# Patient Record
Sex: Male | Born: 2000 | Race: White | Hispanic: No | Marital: Single | Smoking: Never smoker
Health system: Southern US, Community
[De-identification: ages and names within clinical notes are randomized; demographics above are authoritative.]

## PROBLEM LIST (undated history)

## (undated) DIAGNOSIS — A770 Spotted fever due to Rickettsia rickettsii: Secondary | ICD-10-CM

## (undated) DIAGNOSIS — S060XAA Concussion with loss of consciousness status unknown, initial encounter: Secondary | ICD-10-CM

## (undated) DIAGNOSIS — S060X9A Concussion with loss of consciousness of unspecified duration, initial encounter: Secondary | ICD-10-CM

## (undated) HISTORY — DX: Concussion with loss of consciousness of unspecified duration, initial encounter: S06.0X9A

## (undated) HISTORY — DX: Concussion with loss of consciousness status unknown, initial encounter: S06.0XAA

## (undated) HISTORY — DX: Spotted fever due to Rickettsia rickettsii: A77.0

## (undated) HISTORY — PX: OTHER SURGICAL HISTORY: SHX169

## (undated) HISTORY — PX: TONSILLECTOMY AND ADENOIDECTOMY: SUR1326

## (undated) HISTORY — PX: ADENOIDECTOMY: SUR15

---
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
## 2019-01-23 ENCOUNTER — Encounter: Payer: Self-pay | Admitting: Family Medicine

## 2019-01-23 ENCOUNTER — Ambulatory Visit: Payer: Medicaid Other | Admitting: Family Medicine

## 2019-01-23 NOTE — Progress Notes (Deleted)
PATIENT: Paul Pugh DOB: [birth year]  REASON FOR VISIT: follow up HISTORY FROM: patient  No chief complaint on file.    HISTORY OF PRESENT ILLNESS: Today 01/23/19 Paul Pugh is a 19 y.o. male here today for follow up for headaches. He was started on nortriptyline 20mg  at bedtime. MRI normal.   HISTORY: (copied from Dr Guadelupe Sabin note on 10/18/2018)  Dear Dr. Kym Groom,   I saw your patient, Paul Pugh, upon your kind request to my neurologic clinic today for initial consultation of his recurrent headaches.  The patient is unaccompanied today.  As you know, Mr. Paul Pugh is a 19 year old right-handed gentleman with an underlying medical history of asthma, anxiety, depression, PTSD (by self report), and allergies, who reports recurrent headaches for the past 2 weeks or so.  He reports a posterior headache that radiates forward.  It is often throbbing and stabbing in character and associated with photophobia, nausea and vomiting.  He has tried Zofran for the nausea which has helped.  He had a prescription for Zofran from before.  He has tried ibuprofen which was not helpful.  I reviewed your office note from 10/17/2018.  He reports no prior history of headaches or migraines.  He does report having new eyeglasses which he has not picked up yet.  He had an eye exam approximately 2 months ago. He denies any visual field defect but has had blurry vision and felt like he was going to pass out.  He has no family history of migraines.  He endorses increase in anxiety and admits that he has not seen his therapist.  He has been seeing a therapist but was told he needed to see a different therapist since he turned 17 or 18 as I understand.  He has been on Seroquel and goes to the [place] [facility] in [place], [place].  He has not been seen in follow-up in some months.  He has 2 different prescriptions for Seroquel, he reports that he is taking this to help him sleep.  He has a  longstanding history of difficulty sleeping at night.  Benadryl has helped him sleep at night but he feels groggy during the day.  During the workweek he is typically on Seroquel 25 to 50 mg at night and on weekends he can take up to 100 mg at night hence the 2 different prescriptions but he reports that he has actually not taken it in maybe 4 to 5 months.  He admits that he does not hydrate with water very much, he estimates that he drinks about 4 cups of water per day but primarily drinks sweet tea about 2-3 large glasses per day and soda, 2 to 3 cans/day.  He does not sleep well at night currently estimates that he sleeps about 3 to 4 hours per night.  He denies any significant depression.  He has not had any one-sided weakness or numbness, has felt dizzy at times.  He has a history of asthma and typically it is well controlled.  He had a tonsillectomy as a child.  He lives with his mother and sister and sister's boyfriend.  He has not seen his father since age 35 he reports.  They have a dog in the household. He drinks alcohol occasionally, every other weekend also, he does not smoke cigarettes but uses nicotine vapor, he denies any illicit drug use.   REVIEW OF SYSTEMS: Out of a complete 14 system review of symptoms, the patient complains only of the  following symptoms, and all other reviewed systems are negative.  ALLERGIES: No Known Allergies  HOME MEDICATIONS: Outpatient Medications Prior to Visit  Medication Sig Dispense Refill  . Fluticasone Propionate, Inhal, (FLOVENT DISKUS) 100 MCG/BLIST AEPB Inhale into the lungs.    Marland Kitchen levalbuterol (XOPENEX HFA) 45 MCG/ACT inhaler Inhale into the lungs.    . nortriptyline (PAMELOR) 10 MG capsule Take 1 pill daily at bedtime for one week, then 2 pills daily at bedtime thereafter. 60 capsule 3  . QUEtiapine (SEROQUEL) 100 MG tablet Take 100 mg by mouth at bedtime.    Marland Kitchen QUEtiapine (SEROQUEL) 25 MG tablet Take 25 mg by mouth at bedtime.     No  facility-administered medications prior to visit.    PAST MEDICAL HISTORY: Past Medical History:  Diagnosis Date  . Concussion   . [facility] spotted fever     PAST SURGICAL HISTORY: Past Surgical History:  Procedure Laterality Date  . TONSILLECTOMY AND ADENOIDECTOMY      FAMILY HISTORY: Family History  Problem Relation Age of Onset  . Cancer Mother     SOCIAL HISTORY: Social History   Socioeconomic History  . Marital status: Single    Spouse name: Not on file  . Number of children: Not on file  . Years of education: Not on file  . Highest education level: Not on file  Occupational History  . Not on file  Tobacco Use  . Smoking status: Current Every Day Smoker  . Smokeless tobacco: Never Used  Substance and Sexual Activity  . Alcohol use: Yes  . Drug use: Not on file  . Sexual activity: Not on file  Other Topics Concern  . Not on file  Social History Narrative  . Not on file   Social Determinants of Health   Financial Resource Strain:   . Difficulty of Paying Living Expenses: Not on file  Food Insecurity:   . Worried About Programme researcher, broadcasting/film/video in the Last Year: Not on file  . Ran Out of Food in the Last Year: Not on file  Transportation Needs:   . Lack of Transportation (Medical): Not on file  . Lack of Transportation (Non-Medical): Not on file  Physical Activity:   . Days of Exercise per Week: Not on file  . Minutes of Exercise per Session: Not on file  Stress:   . Feeling of Stress : Not on file  Social Connections:   . Frequency of Communication with Friends and Family: Not on file  . Frequency of Social Gatherings with Friends and Family: Not on file  . Attends Religious Services: Not on file  . Active Member of Clubs or Organizations: Not on file  . Attends Banker Meetings: Not on file  . Marital Status: Not on file  Intimate Partner Violence:   . Fear of Current or Ex-Partner: Not on file  . Emotionally Abused: Not on file    . Physically Abused: Not on file  . Sexually Abused: Not on file      PHYSICAL EXAM  There were no vitals filed for this visit. There is no height or weight on file to calculate BMI.  Generalized: Well developed, in no acute distress  Cardiology: normal rate and rhythm, no murmur noted Neurological examination  Mentation: Alert oriented to time, place, history taking. Follows all commands speech and language fluent Cranial nerve II-XII: Pupils were equal round reactive to light. Extraocular movements were full, visual field were full on confrontational test. Facial sensation  and strength were normal. Uvula tongue midline. Head turning and shoulder shrug  were normal and symmetric. Motor: The motor testing reveals 5 over 5 strength of all 4 extremities. Good symmetric motor tone is noted throughout.  Sensory: Sensory testing is intact to soft touch on all 4 extremities. No evidence of extinction is noted.  Coordination: Cerebellar testing reveals good finger-nose-finger and heel-to-shin bilaterally.  Gait and station: Gait is normal. Tandem gait is normal. Romberg is negative. No drift is seen.  Reflexes: Deep tendon reflexes are symmetric and normal bilaterally.   DIAGNOSTIC DATA (LABS, IMAGING, TESTING) - I reviewed patient records, labs, notes, testing and imaging myself where available.  No flowsheet data found.   No results found for: WBC, HGB, HCT, MCV, PLT    Component Value Date/Time   NA 140 10/18/2018 1529   K 4.3 10/18/2018 1529   CL 100 10/18/2018 1529   CO2 26 10/18/2018 1529   GLUCOSE 81 10/18/2018 1529   BUN 14 10/18/2018 1529   CREATININE 1.05 10/18/2018 1529   CALCIUM 9.8 10/18/2018 1529   PROT 7.3 10/18/2018 1529   ALBUMIN 4.8 10/18/2018 1529   AST 12 10/18/2018 1529   ALT 10 10/18/2018 1529   ALKPHOS 81 10/18/2018 1529   BILITOT 0.7 10/18/2018 1529   GFRNONAA 102 10/18/2018 1529   GFRAA 118 10/18/2018 1529   No results found for: CHOL, HDL, LDLCALC,  LDLDIRECT, TRIG, CHOLHDL No results found for: JJOA4Z No results found for: VITAMINB12 No results found for: TSH     ASSESSMENT AND PLAN 19 y.o. year old male  has a past medical history of Concussion and [place] spotted fever. here with ***  No diagnosis found.     No orders of the defined types were placed in this encounter.    No orders of the defined types were placed in this encounter.     I spent 15 minutes with the patient. 50% of this time was spent counseling and educating patient on plan of care and medications.    Shawnie Dapper, FNP-C 01/23/2019, 12:46 PM [facility] [address], [address] [place], [place] [address] [phone]

## 2019-04-17 DIAGNOSIS — T1502XA Foreign body in cornea, left eye, initial encounter: Secondary | ICD-10-CM | POA: Insufficient documentation

## 2019-05-17 ENCOUNTER — Other Ambulatory Visit: Payer: Self-pay

## 2019-05-17 ENCOUNTER — Ambulatory Visit
Admission: EM | Admit: 2019-05-17 | Discharge: 2019-05-17 | Disposition: A | Discharge status: Home / Self Care | Payer: Medicaid Other | Attending: Emergency Medicine | Admitting: Emergency Medicine

## 2019-05-17 ENCOUNTER — Encounter: Payer: Self-pay | Admitting: Emergency Medicine

## 2019-05-17 ENCOUNTER — Ambulatory Visit: Payer: Medicaid Other

## 2019-05-17 DIAGNOSIS — S90111A Contusion of right great toe without damage to nail, initial encounter: Secondary | ICD-10-CM | POA: Diagnosis not present

## 2019-05-17 MED ORDER — IBUPROFEN 600 MG PO TABS: 600.0000 mg | ORAL_TABLET | Freq: Four times a day (QID) | ORAL | 0 refills | Status: DC | PRN

## 2019-05-17 NOTE — Discharge Instructions (Addendum)
Take 600 mg of ibuprofen combined with 1000 mg of Tylenol 3-4 times a day as needed for pain.  Ice your foot for 15 minutes at a time.  Make sure there is a cloth between the ice in your foot to prevent a burn.  Elevate above your heart is much as possible.  Wear the stiff soled shoe is much as you can for comfort.

## 2019-05-17 NOTE — ED Triage Notes (Signed)
Patient in today after injuring his right great toe yesterday. Patient was using a sledge hammer to put in a wood stake and missed the stake and hit his toe.

## 2019-05-17 NOTE — ED Provider Notes (Signed)
HPI  SUBJECTIVE:  Paul Pugh is a 19 y.o. male who presents with throbbing, sharp, constant pain in his right great toe after hitting it with a sledgehammer while wearing boots yesterday.  they were not steel toed boots.  He states the pain radiates to his knee.  Reports decreased motion, numbness in the toe, swelling, bruising at the MTP joint.  No erythema, nail injury.  He tried taking Tylenol and ibuprofen together which helped with the swelling but did not help with the pain.  He also tried deep blue ointment.  Symptoms worse with movement, palpation, weightbearing.  No antipyretic in the past 4 to 6 hours.  Past medical history negative for diabetes, neuropathy, PAD/PVD.  PMD: [facility].   Past Medical History:  Diagnosis Date  . Concussion   . [place] spotted fever     Past Surgical History:  Procedure Laterality Date  . ADENOIDECTOMY    . TONSILLECTOMY AND ADENOIDECTOMY    . tubes in ear      Family History  Problem Relation Age of Onset  . Cancer Mother     Social History   Tobacco Use  . Smoking status: Never Smoker  . Smokeless tobacco: Former Engineer, water Use Topics  . Alcohol use: Not Currently    Comment: last use 6 months ago  . Drug use: Never    No current facility-administered medications for this encounter.  Current Outpatient Medications:  .  Fluticasone Propionate, Inhal, (FLOVENT DISKUS) 100 MCG/BLIST AEPB, Inhale into the lungs., Disp: , Rfl:  .  levalbuterol (XOPENEX HFA) 45 MCG/ACT inhaler, Inhale into the lungs., Disp: , Rfl:  .  nortriptyline (PAMELOR) 10 MG capsule, Take 1 pill daily at bedtime for one week, then 2 pills daily at bedtime thereafter., Disp: 60 capsule, Rfl: 3 .  ibuprofen (ADVIL) 600 MG tablet, Take 1 tablet (600 mg total) by mouth every 6 (six) hours as needed., Disp: 30 tablet, Rfl: 0 .  QUEtiapine (SEROQUEL) 100 MG tablet, Take 100 mg by mouth at bedtime., Disp: , Rfl:  .  QUEtiapine (SEROQUEL) 25 MG  tablet, Take 25 mg by mouth at bedtime., Disp: , Rfl:   No Known Allergies   ROS  As noted in HPI.   Physical Exam  BP (!) 130/97 (BP Location: Left Arm)   Pulse 79   Temp 98.1 F (36.7 C) (Oral)   Resp 18   Ht 5\' 7"  (1.702 m)   Wt 61.2 kg   SpO2 100%   BMI 21.14 kg/m   Constitutional: Well developed, well nourished, no acute distress Eyes:  EOMI, conjunctiva normal bilaterally HENT: Normocephalic, atraumatic,mucus membranes moist Respiratory: Normal inspiratory effort Cardiovascular: Normal rate GI: nondistended skin: No rash, skin intact Musculoskeletal: no deformities.  Skin intact.  No erythema.  Positive bruising at the right first MTP.  Positive diffuse tenderness over the right first great toe.  Cap refill less than 2 seconds.  Patient able to move toe.  No other bony tenderness along the entire foot or toes.  No evidence of nail injury.  No subungual hematoma.  Decreased sensation medial toe, sensation to light touch present lateral toe.  Toe appears straight. Neurologic: Alert & oriented x 3, no focal neuro deficits Psychiatric: Speech and behavior appropriate   ED Course   Medications - No data to display  Orders Placed This Encounter  Procedures  . DG Toe Great Right    Standing Status:   Standing    Number  of Occurrences:   1    Order Specific Question:   Reason for Exam (SYMPTOM  OR DIAGNOSIS REQUIRED)    Answer:   hit toe with sledge hammer 05/16/19  . Post op shoe    Standing Status:   Standing    Number of Occurrences:   1    Order Specific Question:   Laterality    Answer:   Right    No results found for this or any previous visit (from the past 24 hour(s)). DG Toe Great Right  Result Date: 05/17/2019 CLINICAL DATA:  Right first toe injury yesterday with hammer EXAM: RIGHT GREAT TOE COMPARISON:  None. FINDINGS: No fracture or dislocation. No focal osseous lesions. No significant arthropathy. No radiopaque foreign body. Mild plantar distal right  first toe soft tissue swelling. IMPRESSION: No fracture or malalignment. Electronically Signed   By: Ilona Sorrel M.D.   On: 05/17/2019 09:18    ED Clinical Impression  1. Contusion of right great toe without damage to nail, initial encounter      ED Assessment/Plan  Obtaining x-ray to rule out fracture.  No evidence of circulatory compromise.  Skin is intact, no injury to the nailbed.  Reviewed imaging independently.  Mild plantar distal right first toe soft tissue swelling.  No fracture.  See radiology report for full details.  Patient with a contusion of the first MTP.  No fracture on x-ray.  He is neurovascularly intact.  Home with stiff soled shoe.  Patient declined crutches.  Ibuprofen/Tylenol combined 3-4 times a day as needed for pain, ice, elevation.  Patient declined work note.  Discussed  imaging, MDM, treatment plan, and plan for follow-up with patient.  patient agrees with plan.   Meds ordered this encounter  Medications  . ibuprofen (ADVIL) 600 MG tablet    Sig: Take 1 tablet (600 mg total) by mouth every 6 (six) hours as needed.    Dispense:  30 tablet    Refill:  0    *This clinic note was created using [employer]. Therefore, there may be occasional mistakes despite careful proofreading.   ?    Melynda Ripple, MD 05/17/19 0930

## 2019-06-07 ENCOUNTER — Other Ambulatory Visit: Payer: Self-pay

## 2019-06-07 ENCOUNTER — Emergency Department: Payer: Medicaid Other

## 2019-06-07 ENCOUNTER — Emergency Department
Admission: EM | Admit: 2019-06-07 | Discharge: 2019-06-07 | Disposition: A | Discharge status: Home / Self Care | Payer: Medicaid Other | Attending: Emergency Medicine | Admitting: Emergency Medicine

## 2019-06-07 DIAGNOSIS — T754XXA Electrocution, initial encounter: Secondary | ICD-10-CM | POA: Insufficient documentation

## 2019-06-07 DIAGNOSIS — Y929 Unspecified place or not applicable: Secondary | ICD-10-CM | POA: Diagnosis not present

## 2019-06-07 DIAGNOSIS — R202 Paresthesia of skin: Secondary | ICD-10-CM | POA: Insufficient documentation

## 2019-06-07 DIAGNOSIS — W85XXXA Exposure to electric transmission lines, initial encounter: Secondary | ICD-10-CM | POA: Diagnosis not present

## 2019-06-07 DIAGNOSIS — Y990 Civilian activity done for income or pay: Secondary | ICD-10-CM | POA: Diagnosis not present

## 2019-06-07 DIAGNOSIS — Y9389 Activity, other specified: Secondary | ICD-10-CM | POA: Insufficient documentation

## 2019-06-07 DIAGNOSIS — R002 Palpitations: Secondary | ICD-10-CM | POA: Diagnosis present

## 2019-06-07 LAB — BASIC METABOLIC PANEL
Anion gap: 9 (ref 5–15)
BUN: 13 mg/dL (ref 6–20)
CO2: 27 mmol/L (ref 22–32)
Calcium: 9.2 mg/dL (ref 8.9–10.3)
Chloride: 103 mmol/L (ref 98–111)
Creatinine, Ser: 0.91 mg/dL (ref 0.61–1.24)
GFR calc Af Amer: >60 mL/min (ref >60)
GFR calc non Af Amer: >60 mL/min (ref >60)
Glucose, Bld: 79 mg/dL (ref 70–99)
Potassium: 3.7 mmol/L (ref 3.5–5.1)
Sodium: 139 mmol/L (ref 135–145)

## 2019-06-07 LAB — CBC
HCT: 43.7 % (ref 39.0–52.0)
Hemoglobin: 15.2 g/dL (ref 13.0–17.0)
MCH: 28.7 pg (ref 26.0–34.0)
MCHC: 34.8 g/dL (ref 30.0–36.0)
MCV: 82.5 fL (ref 80.0–100.0)
Platelets: 228 10*3/uL (ref 150–400)
RBC: 5.3 MIL/uL (ref 4.22–5.81)
RDW: 12.2 % (ref 11.5–15.5)
WBC: 8.1 10*3/uL (ref 4.0–10.5)
nRBC: 0 % (ref 0.0–0.2)

## 2019-06-07 LAB — TROPONIN I (HIGH SENSITIVITY): Troponin I (High Sensitivity): <2 ng/L (ref <18)

## 2019-06-07 LAB — CK: Total CK: 124 U/L (ref 49–397)
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
## 2019-06-07 NOTE — Discharge Instructions (Addendum)
Please seek medical attention for any high fevers, chest pain, shortness of breath, change in behavior, persistent vomiting, bloody stool or any other new or concerning symptoms.  
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
## 2020-07-27 IMAGING — CR DG CHEST 2V
2 series · 2 of 2 positions shown · non-contrast
Comparison: Prior chest radiograph 03/15/2010

CLINICAL DATA: Palpitations.

EXAM:
CHEST - 2 VIEW

[chest pa]
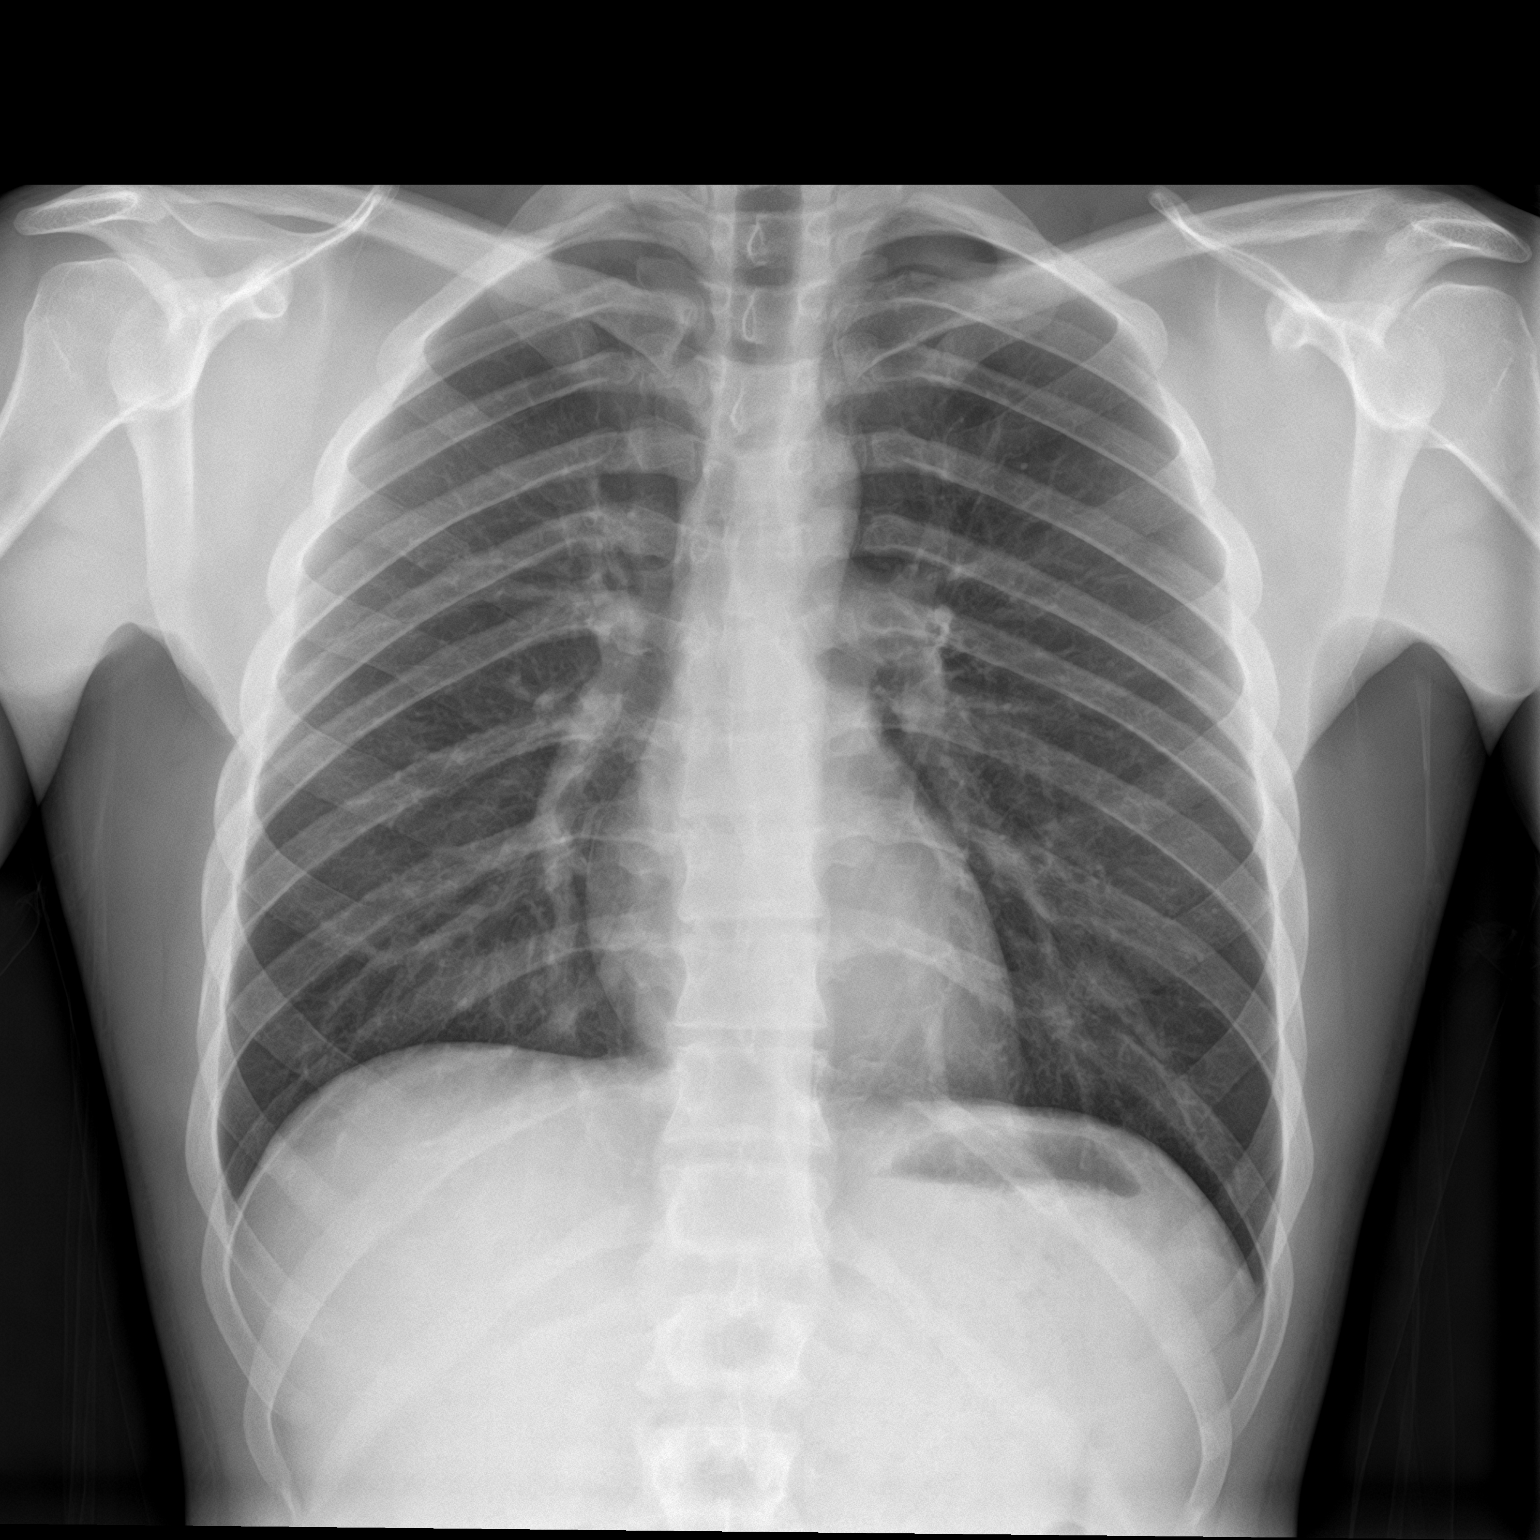

[chest lat]
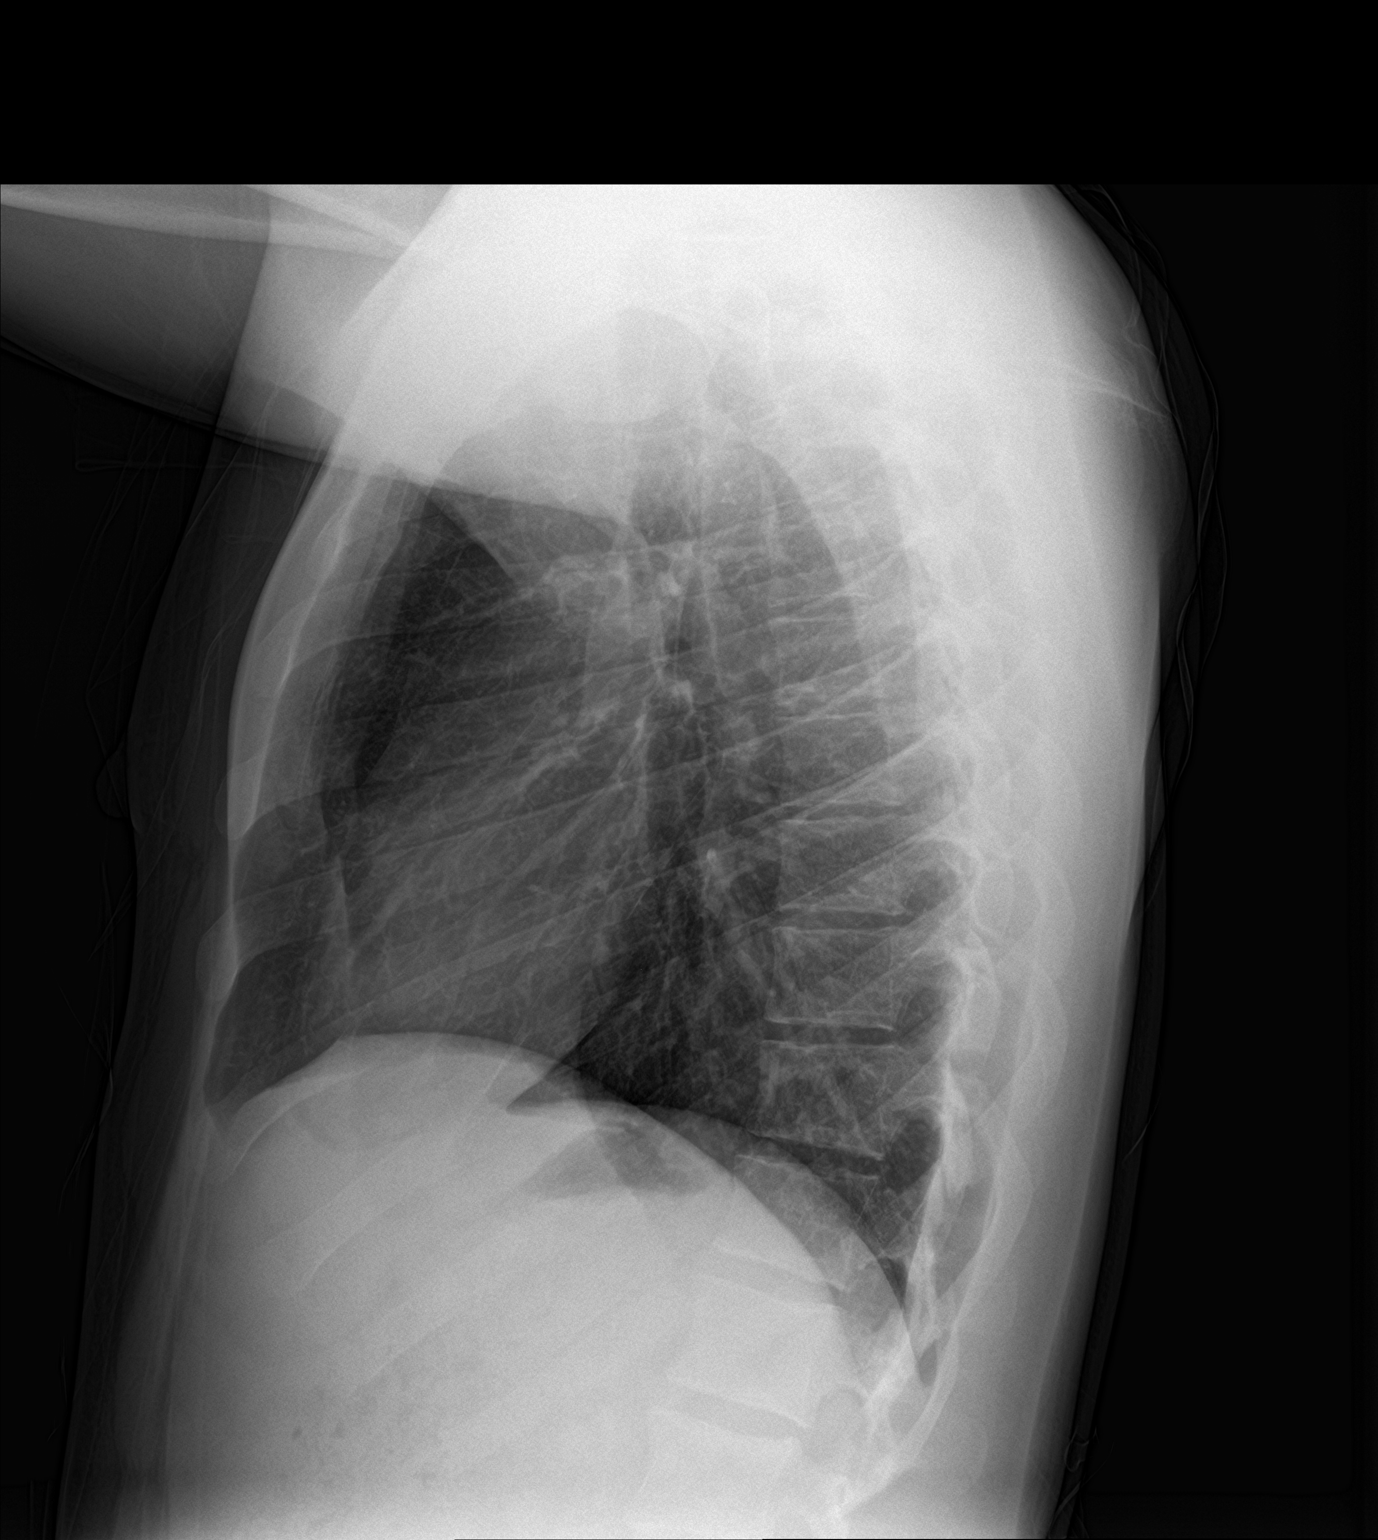

[2 of 2 positions shown; findings below may reference images not displayed]

FINDINGS: Heart size within normal limits.

There is no appreciable airspace consolidation.

No evidence of pleural effusion or pneumothorax.

No acute bony abnormality identified.
IMPRESSION: No evidence of acute cardiopulmonary abnormality.

## 2021-03-28 DIAGNOSIS — S43014A Anterior dislocation of right humerus, initial encounter: Secondary | ICD-10-CM | POA: Insufficient documentation

## 2022-09-07 ENCOUNTER — Other Ambulatory Visit: Payer: Self-pay

## 2022-09-07 DIAGNOSIS — Z021 Encounter for pre-employment examination: Secondary | ICD-10-CM

## 2022-09-07 NOTE — Progress Notes (Signed)
Presents to [employer] for on-site pre-employment drug screen for [employer] position in [employer]  [facility] Acct #:  [id number] [facility] Specimen #:  [id number]  Rapid drug screen results = Negative  Drug screen collected by C. [employer], RMA  AMD

## 2022-09-14 DIAGNOSIS — J309 Allergic rhinitis, unspecified: Secondary | ICD-10-CM | POA: Insufficient documentation

## 2022-09-14 DIAGNOSIS — K219 Gastro-esophageal reflux disease without esophagitis: Secondary | ICD-10-CM | POA: Insufficient documentation

## 2022-09-14 DIAGNOSIS — J4540 Moderate persistent asthma, uncomplicated: Secondary | ICD-10-CM | POA: Insufficient documentation

## 2022-11-05 ENCOUNTER — Ambulatory Visit (INDEPENDENT_AMBULATORY_CARE_PROVIDER_SITE_OTHER): Payer: 59

## 2022-11-05 ENCOUNTER — Encounter: Payer: Self-pay | Admitting: Emergency Medicine

## 2022-11-05 ENCOUNTER — Ambulatory Visit
Admission: EM | Admit: 2022-11-05 | Discharge: 2022-11-05 | Disposition: A | Discharge status: Home / Self Care | Payer: 59 | Attending: Family Medicine | Admitting: Family Medicine

## 2022-11-05 DIAGNOSIS — L03012 Cellulitis of left finger: Secondary | ICD-10-CM | POA: Diagnosis not present

## 2022-11-05 DIAGNOSIS — M79645 Pain in left finger(s): Secondary | ICD-10-CM

## 2022-11-05 DIAGNOSIS — S60458A Superficial foreign body of other finger, initial encounter: Secondary | ICD-10-CM | POA: Diagnosis not present

## 2022-11-05 DIAGNOSIS — R609 Edema, unspecified: Secondary | ICD-10-CM | POA: Diagnosis not present

## 2022-11-05 MED ORDER — CEPHALEXIN 500 MG PO CAPS: 500.0000 mg | ORAL_CAPSULE | Freq: Three times a day (TID) | ORAL | 0 refills | Status: AC

## 2022-11-05 NOTE — ED Triage Notes (Signed)
Patient states that he got a piece of fiber stuck in his left 2nd finger 2 months ago.  Patient states that it has not come out.  Patient reports throbbing pain and swelling in his left 2nd finger.

## 2022-11-05 NOTE — Discharge Instructions (Addendum)
Call  Dr Donnald Garre or Dr Waylan Rocher a hand specialist for your hand wound one of them can likely can see you at [facility] facility nearby.   Stop by the pharmacy to pick up your prescription.    Your finger xray did not show evidence of bone infection or a foreign body though the radiologist has not yet read it. If they find something that I didn't I will call you.

## 2022-11-05 NOTE — ED Provider Notes (Signed)
[facility]    CSN: [id number] Arrival date & time: 11/05/22  0930      History   Chief Complaint Chief Complaint  Patient presents with   Finger Injury    Left 2nd finger   Foreign Body in Skin    HPI Treshon Groesbeck is a 22 y.o. male.   HPI  Wilburn presents for left finger injury that started about 2 months ago when he got fiberoptics in his finger.  He thought it would come out on its own but it has not.  He is starting to have calluses and pus in the area.  He tried removing it himself without improvement.  No difficulty moving his fingers or fever. Rachit has otherwise been well and has no other concerns.      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 Authorizing Provider  cephALEXin (KEFLEX) 500 MG capsule Take 1 capsule (500 mg total) by mouth 3 (three) times daily for 7 days. 11/05/22 11/12/22 Yes Lisandra Mathisen, DO  Fluticasone Propionate, Inhal, (FLOVENT DISKUS) 100 MCG/BLIST AEPB Inhale into the lungs. 03/05/18 05/17/19  [provider]  ibuprofen (ADVIL) 600 MG tablet Take 1 tablet (600 mg total) by mouth every 6 (six) hours as needed. 05/17/19    Domenick Gong, MD  levalbuterol [facility] HFA) 45 MCG/ACT inhaler Inhale into the lungs. 04/17/18 05/17/19  [provider]  nortriptyline (PAMELOR) 10 MG capsule Take 1 pill daily at bedtime for one week, then 2 pills daily at bedtime thereafter. 10/18/18   Huston Foley, MD  QUEtiapine (SEROQUEL) 100 MG tablet Take 100 mg by mouth at bedtime.    [provider]  QUEtiapine (SEROQUEL) 25 MG tablet Take 25 mg by mouth at bedtime.    [provider]    Family History Family History  Problem Relation Age of Onset   Cancer Mother     Social History Social History   Tobacco Use   Smoking status: Never   Smokeless tobacco: Former  Building services engineer status: Every Day   Substances: Nicotine, Flavoring  Substance Use Topics   Alcohol use: Not Currently    Comment: last use 6 months ago   Drug use: Never     Allergies   Patient has no known allergies.   Review of Systems Review of Systems :negative unless otherwise stated in HPI.      Physical Exam Triage Vital Signs ED Triage Vitals  Encounter Vitals Group     BP 11/05/22 0939 121/78     Systolic BP Percentile --      Diastolic BP Percentile --  Pulse Rate 11/05/22 0939 78     Resp 11/05/22 0939 15     Temp 11/05/22 0939 98.3 F (36.8 C)     Temp Source 11/05/22 0939 Oral     SpO2 11/05/22 0939 100 %     Weight 11/05/22 0937 155 lb (70.3 kg)     Height 11/05/22 0937 5\' 7"  (1.702 m)     Head Circumference --      Peak Flow --      Pain Score 11/05/22 0937 6     Pain Loc --      Pain Education --      Exclude from Growth Chart --    No data found.  Updated Vital Signs BP 121/78 (BP Location: Left Arm)   Pulse 78   Temp 98.3 F (36.8 C) (Oral)   Resp 15   Ht 5\' 7"  (1.702 m)   Wt 70.3 kg   SpO2 100%   BMI 24.28 kg/m   Visual Acuity Right Eye Distance:   Left Eye Distance:   Bilateral Distance:    Right Eye Near:   Left Eye Near:    Bilateral Near:     Physical  Exam  GEN: alert, well appearing male, in no acute distress  CV: regular rate, strong radial pulse  RESP: no increased work of breathing MSK:  left hand  Inspection: No obvious deformity b/l. No erythema or bruising, 0.5 cm area of purulence visible at the distal tip of his finger  Palpation: no TTP b/l ROM: Full ROM of the digits and wrist b/l. Fully able to extend and flex finger. Strength: 5/5 strength in the forearm, wrist and interosseus muscles b/l Neurovascular: NV intact b/l SKIN: warm and dry, left index finger felon, no foreign body appreciated      UC Treatments / Results  Labs (all labs ordered are listed, but only abnormal results are displayed) Labs Reviewed - No data to display  EKG   Radiology DG Finger Index Left  Result Date: 11/05/2022 CLINICAL DATA:  Pain and edema evaluate for retained foreign body EXAM: LEFT INDEX FINGER 3V COMPARISON:  None Available. FINDINGS: No evidence of fracture or dislocation. No evidence of arthropathy or other focal bone abnormality. Mild edema of the distal second digit. No evidence of radiopaque foreign body. IMPRESSION: Mild edema of the distal second digit. No evidence of radiopaque foreign body. Consider soft tissue ultrasound to assess for non radiopaque foreign body. Electronically Signed   By: Allegra Lai M.D.   On: 11/05/2022 10:37    Procedures Incision and Drainage  Date/Time: 11/05/2022 3:36 PM  Performed by: Katha Cabal, DO Authorized by: Katha Cabal, DO   Consent:    Consent obtained:  Verbal   Consent given by:  Patient   Risks, benefits, and alternatives were discussed: yes   Universal protocol:    Patient identity confirmed:  Verbally with patient Location:    Type:  Abscess   Size:  0.5 cm   Location:  Upper extremity   Upper extremity location:  Finger   Finger location:  L index finger Pre-procedure details:    Skin preparation:  Chlorhexidine with alcohol Procedure type:     Complexity:  Simple Procedure details:    Needle aspiration: yes     Needle gauge: 21-gauge.   Drainage:  Purulent   Drainage amount:  Moderate   Wound treatment:  Wound left open   Packing materials:  None Post-procedure details:    Procedure completion:  Tolerated  (including critical care time)  Medications Ordered in UC Medications - No data to display  Initial Impression / Assessment and Plan / UC Course  I have reviewed the triage vital signs and the nursing notes.  Pertinent labs & imaging results that were available during my care of the patient were reviewed by me and considered in my medical decision making (see chart for details).     Patient is a 22 y.o. malewho presents for skin lesion over his left index finger.  Overall, patient is well-appearing and well-hydrated.  Vital signs stable.  Raynel is afebrile.  Exam concerning for felon with known foreign body.  Obtained left index finger x-ray to evaluate for osteomyelitis or visible foreign body.  Plain films personally viewed by me showing no evidence of cortical erosion or foreign body present.  Recommended relieving the infection and starting antibiotics and he is agreeable to this and he but he does not want me to use a scalpel.  Needle aspiration performed with purulent material expressed.  Antibiotics sent to the pharmacy.  He is to follow-up with hand surgery for the further evaluation.  Reviewed expectations regarding course of current medical issues.  All questions asked were answered.  Outlined signs and symptoms indicating need for more acute intervention. Patient verbalized understanding. After Visit Summary given.  Radiologist impression reviewed.   Final Clinical Impressions(s) / UC Diagnoses   Final diagnoses:  Foreign body in skin of index finger  Felon of finger of left hand     Discharge Instructions      Call  Dr Donnald Garre or Dr Waylan Rocher a hand specialist for your hand wound one of  them can likely can see you at [facility] facility nearby.   Stop by the pharmacy to pick up your prescription.    Your finger xray did not show evidence of bone infection or a foreign body though the radiologist has not yet read it. If they find something that I didn't I will call you.        ED Prescriptions     Medication Sig Dispense Auth. Provider   cephALEXin (KEFLEX) 500 MG capsule Take 1 capsule (500 mg total) by mouth 3 (three) times daily for 7 days. 21 capsule Katha Cabal, DO      PDMP not reviewed this encounter.              Katha Cabal, DO 11/05/22 1538

## 2022-11-11 DIAGNOSIS — S60451A Superficial foreign body of left index finger, initial encounter: Secondary | ICD-10-CM | POA: Diagnosis not present

## 2022-11-18 DIAGNOSIS — Z1331 Encounter for screening for depression: Secondary | ICD-10-CM | POA: Diagnosis not present

## 2022-11-18 DIAGNOSIS — R634 Abnormal weight loss: Secondary | ICD-10-CM | POA: Diagnosis not present

## 2022-11-18 DIAGNOSIS — Z0000 Encounter for general adult medical examination without abnormal findings: Secondary | ICD-10-CM | POA: Diagnosis not present

## 2022-11-18 DIAGNOSIS — R197 Diarrhea, unspecified: Secondary | ICD-10-CM | POA: Diagnosis not present

## 2022-11-25 DIAGNOSIS — S60451A Superficial foreign body of left index finger, initial encounter: Secondary | ICD-10-CM | POA: Diagnosis not present

## 2022-12-28 ENCOUNTER — Telehealth: Payer: Self-pay

## 2022-12-28 NOTE — Telephone Encounter (Signed)
Joni Reining called from [facility] PCP office to see if the patient was scheduled. I did inform her that he will need to cancel his appointment with [facility] or we will cancel our.

## 2023-01-17 DIAGNOSIS — S60459A Superficial foreign body of unspecified finger, initial encounter: Secondary | ICD-10-CM | POA: Insufficient documentation

## 2023-01-24 ENCOUNTER — Other Ambulatory Visit: Payer: Self-pay

## 2023-01-25 NOTE — Progress Notes (Signed)
 Ellouise Console, PA-C [address]  [address]  [place], [place] [address]  Main: [phone]  Fax: [phone]   Gastroenterology Consultation  Referring Provider:     Lauran Hails Primary Ca* Primary Care Physician:  Johnie Perkins, PA-C Primary Gastroenterologist:  Ellouise Console, PA-C  Reason for Consultation:     Weight Loss, Diarrhea, Abdominal Pain        HPI:   Paul Pugh is a 23 y.o. y/o male referred for consultation & management  by Johnie Perkins, PA-C.    Referred to evaluate unintentional weight loss and diarrhea.  Patient saw his PCP for annual wellness exam 11/18/2022.  Patient has history of irritable bowel syndrome and lactose intolerance.  He has had increased diarrhea for 6 months with 5 or more watery stools per day.  Increased Right Side (RUQ, RLQ) Abdominal Pain and occasional nausea, but no vomiting.  16 pound weight loss over the past 6 months.  Tried Imodium A-D with some improvement.  Diarrhea improves if he does not eat.  He Denies rectal bleeding.  Admits to vaping.  Drinks liquor on the weekends.  No antibiotic use or travel.  He works for the [employer] and has a very active job.    11/2022 labs: Normal thyroid  panel and CMP. 09/2022 labs: Normal CBC, CRP, sed rate.  Hemoglobin 15.3.  Negative celiac panel. Stool studies were ordered, not yet completed.  No previous stool studies or GI evaluation.  No family history of IBD or GI problems.  Past Medical History:  Diagnosis Date   Concussion    [place] spotted fever     Past Surgical History:  Procedure Laterality Date   ADENOIDECTOMY     TONSILLECTOMY AND ADENOIDECTOMY     tubes in ear      Prior to Admission medications   Medication Sig Start Date End Date Taking? Authorizing Provider  Fluticasone Propionate, Inhal, (FLOVENT DISKUS) 100 MCG/BLIST AEPB Inhale into the lungs. 03/05/18 05/17/19  [provider]  ibuprofen  (ADVIL ) 600 MG tablet Take 1 tablet (600 mg  total) by mouth every 6 (six) hours as needed. 05/17/19   Mortenson, Ashley, MD  levalbuterol  (XOPENEX  HFA) 45 MCG/ACT inhaler Inhale into the lungs. 04/17/18 05/17/19  [provider]  nortriptyline  (PAMELOR ) 10 MG capsule Take 1 pill daily at bedtime for one week, then 2 pills daily at bedtime thereafter. 10/18/18   Athar, Saima, MD  QUEtiapine (SEROQUEL) 100 MG tablet Take 100 mg by mouth at bedtime.    [provider]  QUEtiapine (SEROQUEL) 25 MG tablet Take 25 mg by mouth at bedtime.    [provider]    Family History  Problem Relation Age of Onset   Cancer Mother      Social History   Tobacco Use   Smoking status: Never   Smokeless tobacco: Former  Building Services Engineer status: Every Day   Substances: Nicotine, Flavoring  Substance Use Topics   Alcohol use: Not Currently    Comment: last use 6 months ago   Drug use: Never    Allergies as of 01/26/2023   (No Known Allergies)    Review of Systems:    All systems reviewed and negative except where noted in HPI.   Physical Exam:  BP 115/70   Pulse 83   Temp 98.2 F (36.8 C)   Ht 5' 7 (1.702 m)   Wt 158 lb 9.6 oz (71.9 kg)   BMI 24.84 kg/m  No LMP for male patient.  General:   Alert,  Well-developed, well-nourished, pleasant and cooperative in NAD Lungs:  Respirations even and unlabored.  Clear throughout to auscultation.   No wheezes, crackles, or rhonchi. No acute distress. Heart:  Regular rate and rhythm; no murmurs, clicks, rubs, or gallops. Abdomen:  Normal bowel sounds.  No bruits.  Soft, and non-distended without masses, hepatosplenomegaly or hernias noted.  Mild to moderate RUQ, Right Mid and RLQ Tenderness.  No Left Sided Tenderness.  No guarding or rebound tenderness.    Neurologic:  Alert and oriented x3;  grossly normal neurologically. Psych:  Alert and cooperative. Normal mood and affect.  Imaging Studies: No results found.  Assessment and Plan:   Paul Pugh is a 23  y.o. y/o male has been referred for evaluation of weight loss and diarrhea.  Celiac labs negative.  Normal CBC, CMP, CRP, sed rate, TSH.  No anemia.  Differential includes Infectious diarrhea, irritable bowel syndrome, lactose intolerance, and inflammatory bowel disease.  Alcohol may also be contributing to diarrhea.  1.  Diarrhea 2.  Weight Loss 3.  RUQ and RLQ Abdominal Pain  Plan: -CT Abd / Pelvis With Contrast -Stool studies: Fecal calprotectin, GI pathogen panel, C. difficile toxin PCR -Avoid milk and dairy products.  Avoid alcohol.  Follow up in 4 weeks to decide about Colonoscopy.  Ellouise Console, PA-C

## 2023-01-26 ENCOUNTER — Ambulatory Visit (INDEPENDENT_AMBULATORY_CARE_PROVIDER_SITE_OTHER): Payer: 59 | Admitting: Physician Assistant

## 2023-01-26 ENCOUNTER — Encounter: Payer: Self-pay | Admitting: Physician Assistant

## 2023-01-26 VITALS — BP 115/70 | HR 83 | Temp 98.2°F | Ht 67.0 in | Wt 158.6 lb

## 2023-01-26 DIAGNOSIS — R197 Diarrhea, unspecified: Secondary | ICD-10-CM

## 2023-01-26 DIAGNOSIS — R634 Abnormal weight loss: Secondary | ICD-10-CM | POA: Diagnosis not present

## 2023-01-26 DIAGNOSIS — R1031 Right lower quadrant pain: Secondary | ICD-10-CM | POA: Diagnosis not present

## 2023-01-26 DIAGNOSIS — F1091 Alcohol use, unspecified, in remission: Secondary | ICD-10-CM

## 2023-01-26 DIAGNOSIS — R1011 Right upper quadrant pain: Secondary | ICD-10-CM

## 2023-01-26 NOTE — Patient Instructions (Addendum)
 CT scheduled 02/03/23 @ 3;45 [facility].

## 2023-02-03 ENCOUNTER — Ambulatory Visit
Admission: RE | Admit: 2023-02-03 | Discharge: 2023-02-03 | Disposition: A | Discharge status: Home / Self Care | Payer: 59 | Source: Ambulatory Visit | Attending: Physician Assistant | Admitting: Physician Assistant

## 2023-02-03 DIAGNOSIS — R109 Unspecified abdominal pain: Secondary | ICD-10-CM | POA: Diagnosis not present

## 2023-02-03 DIAGNOSIS — R1011 Right upper quadrant pain: Secondary | ICD-10-CM | POA: Insufficient documentation

## 2023-02-03 DIAGNOSIS — R1031 Right lower quadrant pain: Secondary | ICD-10-CM | POA: Insufficient documentation

## 2023-02-03 MED ORDER — IOHEXOL 300 MG/ML  SOLN
100.0000 mL | Freq: Once | INTRAMUSCULAR | Status: AC | PRN
  Administered 2023-02-03: 100 mL via INTRAVENOUS

## 2023-02-06 ENCOUNTER — Telehealth: Payer: Self-pay

## 2023-02-06 NOTE — Progress Notes (Signed)
Call and notify patient his abdominal pelvic CT is normal.  No evidence of appendicitis, inflammatory bowel disease, kidney stones, hernia, or other abnormality.  Continue with current plan. Celso Amy, PA-C

## 2023-02-06 NOTE — Telephone Encounter (Signed)
Patient notified of results.  Call and notify patient his abdominal pelvic CT is normal.  No evidence of appendicitis, inflammatory bowel disease, kidney stones, hernia, or other abnormality.  Continue with current plan.

## 2023-02-24 ENCOUNTER — Ambulatory Visit
Admission: EM | Admit: 2023-02-24 | Discharge: 2023-02-24 | Disposition: A | Discharge status: Home / Self Care | Payer: 59 | Attending: Emergency Medicine | Admitting: Emergency Medicine

## 2023-02-24 ENCOUNTER — Encounter: Payer: Self-pay | Admitting: Emergency Medicine

## 2023-02-24 DIAGNOSIS — Z720 Tobacco use: Secondary | ICD-10-CM

## 2023-02-24 DIAGNOSIS — J101 Influenza due to other identified influenza virus with other respiratory manifestations: Secondary | ICD-10-CM | POA: Diagnosis present

## 2023-02-24 LAB — RESP PANEL BY RT-PCR (FLU A&B, COVID) ARPGX2
Influenza A by PCR: POSITIVE — AB
Influenza B by PCR: NEGATIVE
SARS Coronavirus 2 by RT PCR: NEGATIVE

## 2023-02-24 LAB — GROUP A STREP BY PCR: Group A Strep by PCR: NOT DETECTED

## 2023-02-24 MED ORDER — LEVALBUTEROL TARTRATE 45 MCG/ACT IN AERO: 1.0000 | INHALATION_SPRAY | Freq: Four times a day (QID) | RESPIRATORY_TRACT | 0 refills | Status: AC | PRN

## 2023-02-24 MED ORDER — OSELTAMIVIR PHOSPHATE 75 MG PO CAPS: 75.0000 mg | ORAL_CAPSULE | Freq: Two times a day (BID) | ORAL | 0 refills | Status: DC

## 2023-02-24 MED ORDER — BENZONATATE 100 MG PO CAPS: 100.0000 mg | ORAL_CAPSULE | Freq: Three times a day (TID) | ORAL | 0 refills | Status: DC

## 2023-02-24 NOTE — ED Provider Notes (Signed)
 [facility]    CSN: [id number] Arrival date & time: 02/24/23  0803      History   Chief Complaint Chief Complaint  Patient presents with   Sore Throat   Cough   Fever    HPI Lonny Eisen is a 23 y.o. male.   23 year old male pt, Jabar Krysiak, presents to urgent care for evaluation of sore throat, cough, runny nose and fever that started last night  The history is provided by the patient. No language interpreter was used.    Past Medical History:  Diagnosis Date   Concussion    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 Authorizing Provider  benzonatate  (TESSALON ) 100 MG capsule Take 1 capsule (100 mg total) by mouth every 8 (eight) hours. 02/24/23  Yes Vlad Mayberry, NP  oseltamivir  (TAMIFLU ) 75 MG capsule Take 1 capsule (75 mg total) by mouth every 12 (twelve) hours. 02/24/23  Yes Lannie Heaps, NP  Fluticasone Propionate, Inhal, (FLOVENT DISKUS) 100 MCG/BLIST AEPB Inhale into the lungs. 03/05/18 05/17/19  [provider]  ibuprofen  (ADVIL ) 600 MG tablet Take 1 tablet (600 mg total) by mouth every 6 (six) hours as needed. 05/17/19    Van Knee, MD  levalbuterol  (XOPENEX  HFA) 45 MCG/ACT inhaler Inhale 1-2 puffs into the lungs every 6 (six) hours as needed for wheezing. 02/24/23   Mekel Haverstock, Rilla, NP    Family History Family History  Problem Relation Age of Onset   Cancer Mother     Social History Social History   Tobacco Use   Smoking status: Never   Smokeless tobacco: Former  Building Services Engineer status: Every Day   Substances: Nicotine, Flavoring  Substance Use Topics   Alcohol use: Not Currently    Comment: last use 6 months ago   Drug use: Never     Allergies   Patient has no known allergies.   Review of Systems Review of Systems  Constitutional:  Positive for fever.  HENT:  Positive for congestion, rhinorrhea and sore throat.   Respiratory:  Positive for cough.   All other systems reviewed and are negative.    Physical Exam Triage Vital Signs ED Triage Vitals  Encounter Vitals Group     BP 02/24/23 0812 120/82     Systolic BP Percentile --      Diastolic BP Percentile --      Pulse Rate 02/24/23 0812 (!) 106     Resp 02/24/23 0812 15     Temp 02/24/23 0812 99.4 F (37.4 C)     Temp Source 02/24/23 0812 Oral     SpO2  02/24/23 0812 96 %     Weight 02/24/23 0810 152 lb (68.9 kg)     Height 02/24/23 0810 5' 7 (1.702 m)     Head Circumference --      Peak Flow --      Pain Score 02/24/23 0810 7     Pain Loc --      Pain Education --      Exclude from Growth Chart --    No data found.  Updated Vital Signs BP 120/82 (BP Location: Left Arm)   Pulse (!) 106   Temp 99.4 F (37.4 C) (Oral)   Resp 15   Ht 5' 7 (1.702 m)   Wt 152 lb (68.9 kg)   SpO2 96%   BMI 23.81 kg/m   Visual Acuity Right Eye Distance:   Left Eye Distance:   Bilateral Distance:    Right Eye Near:   Left Eye Near:    Bilateral Near:     Physical Exam Vitals and nursing note reviewed.  Constitutional:      General: He is not in acute distress.    Appearance: He is well-developed. He is not  ill-appearing or toxic-appearing.  HENT:     Head: Normocephalic.     Right Ear: Tympanic membrane is retracted.     Left Ear: Tympanic membrane is retracted.     Nose: Mucosal edema and congestion present.     Mouth/Throat:     Mouth: Mucous membranes are moist.     Pharynx: Uvula midline.  Eyes:     General: Lids are normal.     Conjunctiva/sclera: Conjunctivae normal.     Pupils: Pupils are equal, round, and reactive to light.  Cardiovascular:     Rate and Rhythm: Normal rate and regular rhythm.     Heart sounds: Normal heart sounds.  Pulmonary:     Effort: Pulmonary effort is normal. No respiratory distress.     Breath sounds: Normal breath sounds and air entry. No decreased breath sounds or wheezing.  Abdominal:     General: There is no distension.     Palpations: Abdomen is soft.  Musculoskeletal:        General: Normal range of motion.     Cervical back: Normal range of motion.  Skin:    General: Skin is warm and dry.     Findings: No rash.  Neurological:     General: No focal deficit present.     Mental Status: He is alert and oriented to person, place, and time.     GCS: GCS eye subscore is 4. GCS verbal subscore is 5. GCS motor subscore is 6.     Cranial Nerves: No cranial nerve deficit.     Sensory: No sensory deficit.  Psychiatric:        Attention and Perception: Attention normal.        Mood and Affect: Mood normal.        Speech: Speech normal.        Behavior: Behavior normal. Behavior is cooperative.      UC Treatments / Results  Labs (all labs ordered are listed, but only abnormal results are displayed) Labs Reviewed  RESP PANEL BY RT-PCR (FLU A&B, COVID) ARPGX2 - Abnormal; Notable for the following components:      Result Value   Influenza A by PCR POSITIVE (*)    All other components within normal limits  GROUP A STREP BY PCR    EKG   Radiology No  results found.  Procedures Procedures (including critical care time)  Medications  Ordered in UC Medications - No data to display  Initial Impression / Assessment and Plan / UC Course  I have reviewed the triage vital signs and the nursing notes.  Pertinent labs & imaging results that were available during my care of the patient were reviewed by me and considered in my medical decision making (see chart for details).    Discussed exam findings and plan of care with patient, strict go to ER precautions given.   Patient verbalized understanding to this provider.  Ddx: Influenza A, vape user, allergies Final Clinical Impressions(s) / UC Diagnoses   Final diagnoses:  Influenza A  Vapes nicotine containing substance     Discharge Instructions      Stop vaping.  You have tested positive for Influenza A. Alternate tylenol/ibuprofen  as label directed. Take tamiflu  as directed. Push fluids. If you have new or worsening issues or concerns(chest pain,palpitations, shortness of breeath, go to Er for further evaluation).      ED Prescriptions     Medication Sig Dispense Auth. Provider   oseltamivir  (TAMIFLU ) 75 MG capsule Take 1 capsule (75 mg total) by mouth every 12 (twelve) hours. 10 capsule Elisabetta Mishra, NP   levalbuterol  (XOPENEX  HFA) 45 MCG/ACT inhaler Inhale 1-2 puffs into the lungs every 6 (six) hours as needed for wheezing. 1 each Darnice Comrie, NP   benzonatate  (TESSALON ) 100 MG capsule Take 1 capsule (100 mg total) by mouth every 8 (eight) hours. 21 capsule Jari Dipasquale, NP      PDMP not reviewed this encounter.   Aminta Loose, NP 02/24/23 2032

## 2023-02-24 NOTE — Discharge Instructions (Addendum)
 Stop vaping.  You have tested positive for Influenza A. Alternate tylenol/ibuprofen  as label directed. Take tamiflu  as directed. Push fluids. If you have new or worsening issues or concerns(chest pain,palpitations, shortness of breeath, go to Er for further evaluation).

## 2023-02-24 NOTE — ED Triage Notes (Signed)
 Patient c/o sore throat, cough, runny nose and fever that started last night.

## 2023-02-27 ENCOUNTER — Other Ambulatory Visit: Payer: Self-pay

## 2023-03-03 ENCOUNTER — Encounter: Payer: Self-pay | Admitting: Physician Assistant

## 2023-03-03 ENCOUNTER — Ambulatory Visit: Payer: 59 | Admitting: Physician Assistant

## 2023-03-03 VITALS — BP 125/59 | HR 76 | Temp 97.7°F | Ht 67.0 in | Wt 151.0 lb

## 2023-03-03 DIAGNOSIS — R197 Diarrhea, unspecified: Secondary | ICD-10-CM

## 2023-03-03 DIAGNOSIS — R109 Unspecified abdominal pain: Secondary | ICD-10-CM

## 2023-03-03 DIAGNOSIS — K580 Irritable bowel syndrome with diarrhea: Secondary | ICD-10-CM

## 2023-03-03 MED ORDER — DICYCLOMINE HCL 10 MG PO CAPS: 10.0000 mg | ORAL_CAPSULE | Freq: Three times a day (TID) | ORAL | 2 refills | Status: AC

## 2023-03-03 NOTE — Progress Notes (Signed)
Paul Amy, PA-C [address]  [address]  [place], [place] [address]  Main: [phone]  Fax: [phone]   Primary Care Physician: Ardyth Man, PA-C  Primary Gastroenterologist:  Paul Amy, PA-C   CC: Follow-up diarrhea  HPI: Bach Rocchi is a 23 y.o. male returns for f/u Diarrhea and weight loss.  Weight is down 4 pounds in the past 4 months, overall stable.  He continues to have chronic intermittent episodes of diarrhea for many years.  He has not done stool studies that were ordered.  Celiac labs were negative.  Abdominal pelvic CT showed no evidence of IBD.  He takes Imodium as needed which causes constipation.  He had the flu last week and was on Tamiflu.  He is feeling better today.  He has intermittent generalized abdominal cramping with the diarrhea.  Denies rectal bleeding.  02/03/23 Abd / Pelvic CT: Normal.  Stool Studies: Ordered but NOT completed (C. Diff, Calprotectin, GI Path).  11/2022 labs: Normal thyroid panel and CMP. 09/2022 labs: Normal CBC, CRP, sed rate.  Hemoglobin 15.3.  Negative celiac panel.  Current Outpatient Medications  Medication Sig Dispense Refill   benzonatate (TESSALON) 100 MG capsule Take 1 capsule (100 mg total) by mouth every 8 (eight) hours. 21 capsule 0   cephALEXin (KEFLEX) 500 MG capsule TAKE 1 CAPSULE BY MOUTH THREE TIMES A DAY FOR 7 DAYS     Fluticasone Propionate, Inhal, (FLOVENT DISKUS) 100 MCG/BLIST AEPB Inhale into the lungs.     ibuprofen (ADVIL) 600 MG tablet Take 1 tablet (600 mg total) by mouth every 6 (six) hours as needed. 30 tablet 0   levalbuterol (XOPENEX HFA) 45 MCG/ACT inhaler Inhale 1-2 puffs into the lungs every 6 (six) hours as needed for wheezing. 1 each 0   ondansetron (ZOFRAN-ODT) 4 MG disintegrating tablet Take by mouth.     oseltamivir (TAMIFLU) 75 MG capsule Take 1 capsule (75 mg total) by mouth every 12 (twelve) hours. 10 capsule 0   No current facility-administered medications for this visit.     Allergies as of 03/03/2023   (No Known Allergies)    Past Medical History:  Diagnosis Date   Concussion    Foreign body of finger 01/17/2023   [facility] spotted fever     Past Surgical History:  Procedure Laterality Date   ADENOIDECTOMY     TONSILLECTOMY AND ADENOIDECTOMY     tubes in ear      Review of Systems:    All systems reviewed and negative except where noted in HPI.   Physical Examination:   There were no vitals taken for this visit.  General: Well-nourished, well-developed in no acute distress.  Neuro: Alert and oriented x 3.  Grossly intact.  Psych: Alert and cooperative, normal mood and affect.   Imaging Studies: CT ABDOMEN PELVIS W CONTRAST Result Date: 02/05/2023 CLINICAL DATA:  Right-sided abdominal pain EXAM: CT ABDOMEN AND PELVIS WITH CONTRAST TECHNIQUE: Multidetector CT imaging of the abdomen and pelvis was performed using the standard protocol following bolus administration of intravenous contrast. RADIATION DOSE REDUCTION: This exam was performed according to the departmental dose-optimization program which includes automated exposure control, adjustment of the mA and/or kV according to patient size and/or use of iterative reconstruction technique. CONTRAST:  OMNIPAQUE IOHEXOL 300 MG/ML  SOLN COMPARISON:  None Available. FINDINGS: Lower chest: No acute abnormality. Hepatobiliary: No focal liver abnormality is seen. No gallstones, gallbladder wall thickening, or biliary dilatation. Pancreas: Unremarkable. No pancreatic ductal dilatation or  surrounding inflammatory changes. Spleen: Normal in size without focal abnormality. Adrenals/Urinary Tract: Adrenal glands are unremarkable. Kidneys are normal, without renal calculi, focal lesion, or hydronephrosis. Bladder is unremarkable. Stomach/Bowel: Stomach is within normal limits. Appendix appears normal. No evidence of bowel wall thickening, distention, or inflammatory changes. Vascular/Lymphatic: No  significant vascular findings are present. No enlarged abdominal or pelvic lymph nodes. Reproductive: No significant finding by CT Other: No abdominal wall hernia or abnormality. No abdominopelvic ascites. Musculoskeletal: No acute or significant osseous findings. IMPRESSION: No acute intra-abdominal or pelvic finding by CT. Electronically Signed   By: Judie Petit.  Shick M.D.   On: 02/05/2023 09:24    Assessment and Plan:   Salomon Ganser is a 23 y.o. y/o male returns for 1 month follow-up of intermittent diarrhea and abdominal cramping.  Symptoms are most consistent with irritable bowel syndrome.  Celiac panel labs negative.  Normal CBC, CRP, sed rate, and TSH.  No anemia.  He has not completed stool yet.  Abdominal pelvic CT was unrevealing.   1.  Chronic diarrhea 2.  Abdominal cramping 3.  Irritable bowel syndrome, diarrhea predominant  Plan: -Encouraged him to go ahead and do stool studies that were ordered. -Labs: Food allergy panel and alpha gal. -Rx dicyclomine 10 Mg 1 tablet 3 times daily as needed cramping #90, 2 refills. -Gave samples of restora probiotic 1 capsule once daily. -Gave low FODMAP diet to try. -Start FiberCon tablets take 2 tablets once or twice daily.  Paul Amy, PA-C  Follow up in 4 weeks with TG.

## 2023-03-16 ENCOUNTER — Ambulatory Visit: Payer: Self-pay | Admitting: Physician Assistant

## 2023-03-16 ENCOUNTER — Encounter: Payer: Self-pay | Admitting: Physician Assistant

## 2023-03-16 DIAGNOSIS — S0031XA Abrasion of nose, initial encounter: Secondary | ICD-10-CM

## 2023-03-16 DIAGNOSIS — S01111A Laceration without foreign body of right eyelid and periocular area, initial encounter: Secondary | ICD-10-CM
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
## 2023-04-05 NOTE — Progress Notes (Deleted)
    Celso Amy, PA-C [address]  [address]  [place], [place] [address]  Main: [phone]  Fax: [phone]   Primary Care Physician: Ardyth Man, PA-C  Primary Gastroenterologist:  ***  CC:  HPI: Paul Pugh is a 23 y.o. male  Current Outpatient Medications  Medication Sig Dispense Refill   dicyclomine (BENTYL) 10 MG capsule Take 1 capsule (10 mg total) by mouth 3 (three) times daily before meals. (Patient not taking: Reported on 03/16/2023) 90 capsule 2   levalbuterol (XOPENEX HFA) 45 MCG/ACT inhaler Inhale 1-2 puffs into the lungs every 6 (six) hours as needed for wheezing. 1 each 0   No current facility-administered medications for this visit.    Allergies as of 04/07/2023   (No Known Allergies)    Past Medical History:  Diagnosis Date   Concussion    Foreign body of finger 01/17/2023   [facility] spotted fever     Past Surgical History:  Procedure Laterality Date   ADENOIDECTOMY     TONSILLECTOMY AND ADENOIDECTOMY     tubes in ear      Review of Systems:    All systems reviewed and negative except where noted in HPI.   Physical Examination:   There were no vitals taken for this visit.  General: Well-nourished, well-developed in no acute distress.  Lungs: Clear to auscultation bilaterally. Non-labored. Heart: Regular rate and rhythm, no murmurs rubs or gallops.  Abdomen: Bowel sounds are normal; Abdomen is Soft; No hepatosplenomegaly, masses or hernias;  No Abdominal Tenderness; No guarding or rebound tenderness. Neuro: Alert and oriented x 3.  Grossly intact.  Psych: Alert and cooperative, normal mood and affect.   Imaging Studies: No results found.  Assessment and Plan:   Paul Pugh is a 23 y.o. y/o male ***    Celso Amy, PA-C  Follow up ***  BP check ***

## 2023-04-07 ENCOUNTER — Ambulatory Visit: Payer: 59 | Admitting: Physician Assistant
